# Patient Record
Sex: Male | Born: 1955 | Race: Black or African American | Hispanic: No | Smoking: Never smoker
Health system: Southern US, Community
[De-identification: ages and names within clinical notes are randomized; demographics above are authoritative.]

## PROBLEM LIST (undated history)

## (undated) DIAGNOSIS — I10 Essential (primary) hypertension: Secondary | ICD-10-CM

## (undated) DIAGNOSIS — E119 Type 2 diabetes mellitus without complications: Secondary | ICD-10-CM

---

## 1998-05-13 ENCOUNTER — Emergency Department (HOSPITAL_COMMUNITY): Admission: EM | Admit: 1998-05-13 | Discharge: 1998-05-13 | Payer: Self-pay | Admitting: Emergency Medicine

## 2007-07-08 ENCOUNTER — Emergency Department (HOSPITAL_COMMUNITY): Admission: EM | Admit: 2007-07-08 | Discharge: 2007-07-08 | Payer: Self-pay | Admitting: Emergency Medicine

## 2008-06-20 ENCOUNTER — Emergency Department (HOSPITAL_COMMUNITY): Admission: EM | Admit: 2008-06-20 | Discharge: 2008-06-20 | Payer: Self-pay | Admitting: Family Medicine

## 2009-12-08 ENCOUNTER — Emergency Department (HOSPITAL_COMMUNITY): Admission: EM | Admit: 2009-12-08 | Discharge: 2009-12-08 | Payer: Self-pay | Admitting: Emergency Medicine

## 2010-03-22 ENCOUNTER — Emergency Department (HOSPITAL_COMMUNITY): Admission: EM | Admit: 2010-03-22 | Discharge: 2010-03-22 | Payer: Self-pay | Admitting: Emergency Medicine

## 2011-02-09 LAB — DIFFERENTIAL
Basophils Absolute: 0.1 10*3/uL (ref 0.0–0.1)
Eosinophils Absolute: 0.5 10*3/uL (ref 0.0–0.7)
Lymphocytes Relative: 28 % (ref 12–46)
Neutro Abs: 4.7 10*3/uL (ref 1.7–7.7)
Neutrophils Relative %: 56 % (ref 43–77)

## 2011-02-09 LAB — CBC
Hemoglobin: 16.6 g/dL (ref 13.0–17.0)
MCHC: 33 g/dL (ref 30.0–36.0)
Platelets: 271 10*3/uL (ref 150–400)
RDW: 12.3 % (ref 11.5–15.5)

## 2011-02-09 LAB — BASIC METABOLIC PANEL
BUN: 17 mg/dL (ref 6–23)
CO2: 25 mEq/L (ref 19–32)
Calcium: 9.4 mg/dL (ref 8.4–10.5)
Creatinine, Ser: 0.91 mg/dL (ref 0.4–1.5)
Glucose, Bld: 179 mg/dL — ABNORMAL HIGH (ref 70–99)

## 2011-02-09 LAB — GLUCOSE, CAPILLARY: Glucose-Capillary: 237 mg/dL — ABNORMAL HIGH (ref 70–99)

## 2011-03-07 ENCOUNTER — Inpatient Hospital Stay (HOSPITAL_COMMUNITY)
Admission: EM | Admit: 2011-03-07 | Discharge: 2011-03-09 | DRG: 638 | Disposition: A | Discharge status: Home / Self Care | Payer: Medicaid Other | Attending: Internal Medicine | Admitting: Internal Medicine

## 2011-03-07 DIAGNOSIS — E1310 Other specified diabetes mellitus with ketoacidosis without coma: Principal | ICD-10-CM | POA: Diagnosis present

## 2011-03-07 DIAGNOSIS — D72823 Leukemoid reaction: Secondary | ICD-10-CM | POA: Diagnosis present

## 2011-03-07 DIAGNOSIS — N179 Acute kidney failure, unspecified: Secondary | ICD-10-CM | POA: Diagnosis present

## 2011-03-07 DIAGNOSIS — I10 Essential (primary) hypertension: Secondary | ICD-10-CM | POA: Diagnosis present

## 2011-03-07 DIAGNOSIS — T50995A Adverse effect of other drugs, medicaments and biological substances, initial encounter: Secondary | ICD-10-CM | POA: Diagnosis present

## 2011-03-07 DIAGNOSIS — Z9119 Patient's noncompliance with other medical treatment and regimen: Secondary | ICD-10-CM

## 2011-03-07 DIAGNOSIS — Z91199 Patient's noncompliance with other medical treatment and regimen due to unspecified reason: Secondary | ICD-10-CM

## 2011-03-07 DIAGNOSIS — D72829 Elevated white blood cell count, unspecified: Secondary | ICD-10-CM | POA: Diagnosis present

## 2011-03-07 LAB — GLUCOSE, CAPILLARY
Glucose-Capillary: 538 mg/dL — ABNORMAL HIGH (ref 70–99)
Glucose-Capillary: 350 mg/dL — ABNORMAL HIGH (ref 70–99)
Glucose-Capillary: 241 mg/dL — ABNORMAL HIGH (ref 70–99)

## 2011-03-07 LAB — CBC
MCHC: 34.2 g/dL (ref 30.0–36.0)
Platelets: 329 10*3/uL (ref 150–400)
RDW: 12.3 % (ref 11.5–15.5)

## 2011-03-07 LAB — COMPREHENSIVE METABOLIC PANEL
ALT: 31 U/L (ref 0–53)
AST: 21 U/L (ref 0–37)
Calcium: 9.2 mg/dL (ref 8.4–10.5)
GFR calc Af Amer: 34 mL/min — ABNORMAL LOW (ref >60)
Sodium: 135 mEq/L (ref 135–145)
Total Protein: 7.8 g/dL (ref 6.0–8.3)

## 2011-03-07 LAB — URINALYSIS, ROUTINE W REFLEX MICROSCOPIC
Bilirubin Urine: NEGATIVE
Glucose, UA: >1000 mg/dL — AB
Ketones, ur: NEGATIVE mg/dL
Leukocytes, UA: NEGATIVE
pH: 5 (ref 5.0–8.0)

## 2011-03-07 LAB — DIFFERENTIAL
Basophils Absolute: 0.1 10*3/uL (ref 0.0–0.1)
Basophils Relative: 1 % (ref 0–1)
Eosinophils Relative: 1 % (ref 0–5)
Monocytes Absolute: 1 10*3/uL (ref 0.1–1.0)
Neutro Abs: 11.2 10*3/uL — ABNORMAL HIGH (ref 1.7–7.7)

## 2011-03-07 LAB — BLOOD GAS, VENOUS
Acid-base deficit: 2.8 mmol/L — ABNORMAL HIGH (ref 0.0–2.0)
Patient temperature: 98.6
TCO2: 16.5 mmol/L (ref 0–100)
pH, Ven: 7.415 — ABNORMAL HIGH (ref 7.250–7.300)

## 2011-03-08 ENCOUNTER — Inpatient Hospital Stay (HOSPITAL_COMMUNITY): Payer: Medicaid Other

## 2011-03-08 LAB — GLUCOSE, CAPILLARY
Glucose-Capillary: 109 mg/dL — ABNORMAL HIGH (ref 70–99)
Glucose-Capillary: 145 mg/dL — ABNORMAL HIGH (ref 70–99)
Glucose-Capillary: 159 mg/dL — ABNORMAL HIGH (ref 70–99)
Glucose-Capillary: 212 mg/dL — ABNORMAL HIGH (ref 70–99)
Glucose-Capillary: 236 mg/dL — ABNORMAL HIGH (ref 70–99)
Glucose-Capillary: 86 mg/dL (ref 70–99)

## 2011-03-08 LAB — CBC
HCT: 43.6 % (ref 39.0–52.0)
Hemoglobin: 14.8 g/dL (ref 13.0–17.0)
RBC: 4.91 MIL/uL (ref 4.22–5.81)
WBC: 16.2 10*3/uL — ABNORMAL HIGH (ref 4.0–10.5)

## 2011-03-08 LAB — BASIC METABOLIC PANEL
CO2: 26 mEq/L (ref 19–32)
Chloride: 107 mEq/L (ref 96–112)
Glucose, Bld: 164 mg/dL — ABNORMAL HIGH (ref 70–99)
Potassium: 3.3 mEq/L — ABNORMAL LOW (ref 3.5–5.1)
Sodium: 143 mEq/L (ref 135–145)

## 2011-03-08 LAB — HEMOGLOBIN A1C: Hgb A1c MFr Bld: 12 % — ABNORMAL HIGH (ref <5.7)

## 2011-03-09 LAB — LIPID PANEL
LDL Cholesterol: 72 mg/dL (ref 0–99)
Total CHOL/HDL Ratio: 4.2 RATIO
VLDL: 29 mg/dL (ref 0–40)

## 2011-03-09 LAB — GLUCOSE, CAPILLARY
Glucose-Capillary: 160 mg/dL — ABNORMAL HIGH (ref 70–99)
Glucose-Capillary: 228 mg/dL — ABNORMAL HIGH (ref 70–99)
Glucose-Capillary: 254 mg/dL — ABNORMAL HIGH (ref 70–99)

## 2011-03-09 LAB — COMPREHENSIVE METABOLIC PANEL
Albumin: 3 g/dL — ABNORMAL LOW (ref 3.5–5.2)
Alkaline Phosphatase: 91 U/L (ref 39–117)
BUN: 16 mg/dL (ref 6–23)
Chloride: 104 mEq/L (ref 96–112)
Creatinine, Ser: 0.83 mg/dL (ref 0.4–1.5)
GFR calc non Af Amer: >60 mL/min (ref >60)
Glucose, Bld: 153 mg/dL — ABNORMAL HIGH (ref 70–99)
Total Bilirubin: 1.9 mg/dL — ABNORMAL HIGH (ref 0.3–1.2)

## 2011-03-09 LAB — URINE CULTURE
Culture  Setup Time: 201204152004
Culture: NO GROWTH

## 2011-03-09 LAB — CBC
HCT: 42.6 % (ref 39.0–52.0)
MCH: 30 pg (ref 26.0–34.0)
MCV: 87.5 fL (ref 78.0–100.0)
Platelets: 218 10*3/uL (ref 150–400)
RBC: 4.87 MIL/uL (ref 4.22–5.81)

## 2011-03-09 LAB — TSH: TSH: 0.74 u[IU]/mL (ref 0.350–4.500)

## 2011-03-09 LAB — MAGNESIUM: Magnesium: 1.8 mg/dL (ref 1.5–2.5)

## 2011-03-09 NOTE — Discharge Summary (Signed)
Bobby Palmer, Bobby Palmer              ACCOUNT NO.:  [id number]  MEDICAL RECORD NO.:  [id number]           PATIENT TYPE:  I  LOCATION:  1510                         FACILITY:  [facility]  PHYSICIAN:  Conley Canal, MD      DATE OF BIRTH:  [birth year]  DATE OF ADMISSION:  03/07/2011 DATE OF DISCHARGE:  03/09/2011                         DISCHARGE SUMMARY-REFERRING   DISCHARGE DIAGNOSES: 1. Diabetes mellitus type 2, poorly controlled resulting in     hyperglycemia and mild diabetic ketoacidosis. 2. Acute renal failure related to dehydration, medications including     metformin, triamterene, HCTZ, and Bactrim. 3. Leukocytosis possibly partially treated of urinary source versus     leukemoid reaction. 4. Hypertension, well controlled.  DISCHARGE MEDICATIONS: 1. Amlodipine 5 mg daily. 2. Lantus 10 units subcu at bedtime. 3. Metformin 850 mg p.o. b.i.d. 4. Ciprofloxacin 500 mg twice daily for 5 more days. 5. Aspirin 81 mg daily.  PROCEDURES PERFORMED:  Chest x-ray, on March 09, 2011, showed no evidence of pulmonary disease.  HOSPITAL COURSE:  Mr. Herder is a pleasant 55 year old gentleman with comorbidities as noted above, who came to the hospital with complaints of vomiting preceded by few days of sore throat and fever for which he had been given Bactrim by his primary care provider.  At the time of admission, the patient was found to be hyperglycemic with glucose more than 688.  He was also in acute renal failure with a BUN of 29, creatinine 2.45.  His urinalysis was significant for glucosuria and his hemoglobin A1c was found to be 12.  He had leukocytosis with a white count of 16,000 and his urine culture was eventually negative.  The patient was given Bactrim, adjusted for creatinine clearance.  He will be discharged on ciprofloxacin for 5 more days.  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  A malaria smear was sent prior to patient discharge.  I will follow up  the malaria smear.  If it is abnormal, communicate with the patient and arrange for treatments.  I doubt that he has cyclical malaria, which has not been treated already. He is originally from [place].  Otherwise, it seems the main issue has been noncompliant.  Today, the patient feels much better.  His white count is 10.6.  The rest of the CBC is normal.  Electrolytes significant for potassium of 3.3, this was replenished.  Magnesium 1.8, also replenished.  Lipids panel significant for HDL 32, LDL 72, total cholesterol 161, TSH normal.  I encouraged him to keep a logbook of fingersticks before meals and to take this logbook to his primary care provider next week for further adjustment of medications.  At this point with a hemoglobin of 12, he needs to be on insulin until the hemoglobin has improved significantly, so I am discharging him on Lantus and metformin and he will need further adjustments of the metformin and Lantus by his PCP.  He wants to change PCPs to [facility], but the earliest appointment some time in May and June, so he will see Dr. Mayford Knife next week.  Otherwise he is discharged in stable condition.  Conley Canal, MD     SR/MEDQ  D:  03/09/2011  T:  03/09/2011  Job:  [id number]  cc:   Dr. Mayford Knife at [facility]  Electronically Signed by Conley Canal  on 03/09/2011 09:14:57 PM

## 2011-03-10 LAB — MALARIA SMEAR

## 2011-03-31 NOTE — H&P (Signed)
[name], Bobby Palmer              ACCOUNT NO.:  [id number]  MEDICAL RECORD NO.:  [id number]           PATIENT TYPE:  E  LOCATION:  WLED                         FACILITY:  [facility]  PHYSICIAN:  Calvert Cantor, M.D.     DATE OF BIRTH:  [birth year]  DATE OF ADMISSION:  03/07/2011 DATE OF DISCHARGE:                             HISTORY & PHYSICAL   PRIMARY CARE PHYSICIAN:  Dr. Mayford Knife on [facility] in [place].  PRESENTING COMPLAINT:  Vomiting.  HISTORY OF PRESENT ILLNESS:  A 55 year old male with a history of diabetes and hypertension comes in with vomiting that started today.  The patient states that few days ago, he had a sore throat and fever and went to his doctor's office and was prescribed an antibiotic.  He is not sure if the fever has resolved but he still has a sore throat.  He started vomiting today.  He has had increased thirst and has been drinking a lot of fluids over the past few days and urinating excessively.  He feels extremely weak.  The patient does not admit to any cough, shortness of breath, or sinus trouble.  Other than a sore throat, he has no symptoms of infection.  PAST MEDICAL HISTORY: 1. Hypertension. 2. Diabetes for the past 2 years.  PAST SURGICAL HISTORY:  None.  SOCIAL HISTORY:  Nonsmoker, does not drink, does not use any drugs, currently not working.  Lives with his wife and kids.  ALLERGIES:  No known drug allergies.  FAMILY HISTORY:  He is not aware of any medical problems in the family.  HOME MEDICATIONS: 1. Septra 800 mg/160 twice a day. 2. Triamterene/HCTZ 37.5/25 daily. 3. Metformin 500 mg twice a day.  He was for some reason given another     prescription for metformin by his PCP a few days ago.  He went and     got it failed.  He has not realized that he is already taking     metformin and has actually been taking the 500 mg 2 tabs in the     morning and 2 at night.  REVIEW OF SYSTEMS:  CONSTITUTIONAL:  He has lost about 6-7  pounds in the past few days.  He is not sure if he is still febrile.  He has fatigue. HEENT:  No blurred vision, double vision.  He has a sore throat.  No sinus trouble.  No earache or fullness in his ears.  RESPIRATORY:  No shortness of breath or cough.  No wheezing.  CARDIAC:  No chest pain, palpitations, or pedal edema.  GI:  Vomiting.  No abdominal pain.  No diarrhea or constipation.  GU:  Increased frequency of micturition, excessive urination.  No dysuria or hematuria.  HEMATOLOGIC:  No easy bruising.  SKIN:  No rash.  MUSCULOSKELETAL:  No joint pain or back pain.  NEUROLOGICAL:  No focal numbness, weakness, or tingling.  No history of strokes or seizures.  PSYCHOLOGICAL:  No anxiety, depression.  PHYSICAL EXAMINATION:  VITAL SIGNS:  Blood pressure 126/87, pulse 112, respiratory rate 18, temperature 98.3, oxygen saturation 97% on room air. HEENT:  Pupils  equal, round, reactive to light.  Extraocular movements are intact.  Conjunctivae is pink.  No scleral icterus.  Oral mucosa dry.  Oropharynx clear. NECK:  Supple.  No thyromegaly, lymphadenopathy, carotid bruits. HEART:  Regular rate and rhythm.  No murmurs, rubs, or gallops. LUNGS:  Clear bilaterally.  Normal respiratory effort.  No use of accessory muscles. ABDOMEN:  Soft, nontender, nondistended.  Bowel sounds positive.  No organomegaly. EXTREMITIES:  No cyanosis, clubbing, or edema.  Pedal pulses positive. NEUROLOGICAL:  Cranial nerves II-XII intact.  Strength intact in all 4 extremities. PSYCHOLOGICAL:  Awake, alert, oriented x3.  Mood and affect normal. SKIN:  Warm, dry.  No rash or bruising.  BLOOD WORK:  Abnormal blood work includes a BUN of 29 and a creatinine of 2.45 and WBC count 14.1.  ABG reveals a pH of 7.41, pCO2 31.9, pO2 73.6, oxygen saturation was 93%, bicarb 26.  EKG shows sinus tachycardia at 102 beats per minute with a prolonged QTC at 508 milliseconds.  ASSESSMENT AND PLAN: 1. Hyperglycemia.  The  patient is on a glucommander.  We will get a     hemoglobin A1c.  We will need to hold his metformin due to renal     failure.  He will be on clear liquids for now. 2. Vomiting.  Hydrate with IV fluids, clear liquids as mentioned     above, advance to regular as tolerated. 3. Acute renal failure with a creatinine of 2.45, previously his     creatinine was normal.  We will continue IV fluids, hold metformin     and triamterene/HCTZ.  This may be a combination of ATN and     dehydration.  If creatinine does not improve, we can do further     workup which would also include a renal ultrasound. 4. Questionable bacterial infection.  He is on Septra DS.  We will     place him on a single strength Septra as his creatinine clearance     is 30. 5. Hypertension.  Hold triamterene and HCTZ.  For now I will place him     on Norvasc. 6. Prolonged QTC.  Be careful with medications that can further     prolong his QTC. 7. Alkalosis likely secondary to vomiting.  He may have been vomiting     more than just today. 8. Deep venous thrombosis prophylaxis with Lovenox.  Time on admission 55 minutes.    Calvert Cantor, M.D.    SR/MEDQ  D:  03/07/2011  T:  03/07/2011  Job:  [id number]  cc:   Dr. Mayford Knife  Electronically Signed by Calvert Cantor M.D. on 03/31/2011 11:17:50 PM

## 2011-08-20 LAB — POCT URINALYSIS DIP (DEVICE)
Hgb urine dipstick: NEGATIVE
Ketones, ur: >=160 — AB
Operator id: 247071
Protein, ur: 30 — AB
Specific Gravity, Urine: 1.02
Urobilinogen, UA: 0.2

## 2011-08-20 LAB — POCT I-STAT, CHEM 8
BUN: 16
Calcium, Ion: 1.17
Creatinine, Ser: 0.9
Glucose, Bld: 314 — ABNORMAL HIGH
Hemoglobin: 18.7 — ABNORMAL HIGH
TCO2: 22

## 2015-01-07 ENCOUNTER — Emergency Department (HOSPITAL_COMMUNITY): Payer: Medicaid Other

## 2015-01-07 ENCOUNTER — Encounter (HOSPITAL_COMMUNITY): Payer: Self-pay | Admitting: *Deleted

## 2015-01-07 ENCOUNTER — Emergency Department (HOSPITAL_COMMUNITY)
Admission: EM | Admit: 2015-01-07 | Discharge: 2015-01-07 | Disposition: A | Discharge status: Home / Self Care | Payer: Medicaid Other | Attending: Emergency Medicine | Admitting: Emergency Medicine

## 2015-01-07 DIAGNOSIS — Z79899 Other long term (current) drug therapy: Secondary | ICD-10-CM | POA: Insufficient documentation

## 2015-01-07 DIAGNOSIS — R05 Cough: Secondary | ICD-10-CM | POA: Insufficient documentation

## 2015-01-07 DIAGNOSIS — I10 Essential (primary) hypertension: Secondary | ICD-10-CM | POA: Insufficient documentation

## 2015-01-07 DIAGNOSIS — R059 Cough, unspecified: Secondary | ICD-10-CM

## 2015-01-07 DIAGNOSIS — R42 Dizziness and giddiness: Secondary | ICD-10-CM | POA: Insufficient documentation

## 2015-01-07 DIAGNOSIS — R739 Hyperglycemia, unspecified: Secondary | ICD-10-CM

## 2015-01-07 DIAGNOSIS — E1165 Type 2 diabetes mellitus with hyperglycemia: Secondary | ICD-10-CM | POA: Insufficient documentation

## 2015-01-07 HISTORY — DX: Essential (primary) hypertension: I10

## 2015-01-07 HISTORY — DX: Type 2 diabetes mellitus without complications: E11.9

## 2015-01-07 LAB — BASIC METABOLIC PANEL
ANION GAP: 6 (ref 5–15)
BUN: 16 mg/dL (ref 6–23)
CALCIUM: 9.1 mg/dL (ref 8.4–10.5)
CHLORIDE: 102 mmol/L (ref 96–112)
CO2: 25 mmol/L (ref 19–32)
Creatinine, Ser: 0.96 mg/dL (ref 0.50–1.35)
GFR calc Af Amer: >90 mL/min (ref >90)
GFR calc non Af Amer: >90 mL/min (ref >90)
Glucose, Bld: 377 mg/dL — ABNORMAL HIGH (ref 70–99)
POTASSIUM: 3.8 mmol/L (ref 3.5–5.1)
SODIUM: 133 mmol/L — AB (ref 135–145)

## 2015-01-07 LAB — CBC
HEMATOCRIT: 48.9 % (ref 39.0–52.0)
HEMOGLOBIN: 16.7 g/dL (ref 13.0–17.0)
MCH: 30.6 pg (ref 26.0–34.0)
MCHC: 34.2 g/dL (ref 30.0–36.0)
MCV: 89.6 fL (ref 78.0–100.0)
Platelets: 245 10*3/uL (ref 150–400)
RBC: 5.46 MIL/uL (ref 4.22–5.81)
RDW: 12 % (ref 11.5–15.5)
WBC: 9.3 10*3/uL (ref 4.0–10.5)

## 2015-01-07 LAB — CBG MONITORING, ED: Glucose-Capillary: 288 mg/dL — ABNORMAL HIGH (ref 70–99)

## 2015-01-07 MED ORDER — SODIUM CHLORIDE 0.9 % IV BOLUS (SEPSIS)
1000.0000 mL | Freq: Once | INTRAVENOUS | Status: AC
  Administered 2015-01-07: 1000 mL via INTRAVENOUS

## 2015-01-07 MED ORDER — INSULIN ASPART 100 UNIT/ML ~~LOC~~ SOLN
15.0000 [IU] | Freq: Once | SUBCUTANEOUS | Status: AC
  Administered 2015-01-07: 15 [IU] via SUBCUTANEOUS
  Filled 2015-01-07: qty 1

## 2015-01-07 NOTE — ED Provider Notes (Signed)
CSN: [id number]     Arrival date & time 01/07/15  1830 History   First MD Initiated Contact with Patient 01/07/15 2201     Chief Complaint  Patient presents with  . Dizziness  . Cough     (Consider location/radiation/quality/duration/timing/severity/associated sxs/prior Treatment) HPI Comments: Patient presents to the ER for evaluation of dizziness. Patient reports that he felt an episode of dizziness which she described as being lightheaded earlier. He feels better now. There was no headache. He does not have blurred vision. Patient denies chest pain and shortness of breath, but he has had a cough for 1 day. There has not been any fever. Denies vomiting and diarrhea.  Patient is a 59 y.o. male presenting with dizziness and cough.  Dizziness Cough   Past Medical History  Diagnosis Date  . Hypertension   . Diabetes mellitus without complication    No past surgical history on file. No family history on file. History  Substance Use Topics  . Smoking status: Never Smoker   . Smokeless tobacco: Not on file  . Alcohol Use: No    Review of Systems  Respiratory: Positive for cough.   Neurological: Positive for dizziness.  All other systems reviewed and are negative.     Allergies  Review of patient's allergies indicates no known allergies.  Home Medications   Prior to Admission medications   Medication Sig Start Date End Date Taking? Authorizing Provider  atenolol (TENORMIN) 100 MG tablet Take 100 mg by mouth daily.   Yes Historical Provider, MD  glipiZIDE (GLUCOTROL) 10 MG tablet Take 10 mg by mouth daily before breakfast.   Yes Historical Provider, MD  hydrochlorothiazide (HYDRODIURIL) 25 MG tablet Take 25 mg by mouth daily.   Yes Historical Provider, MD  ibuprofen (ADVIL,MOTRIN) 200 MG tablet Take 200 mg by mouth every 6 (six) hours as needed for moderate pain.   Yes Historical Provider, MD  metFORMIN (GLUCOPHAGE) 1000 MG tablet Take 1,000 mg by mouth 2 (two) times daily  with a meal.   Yes Historical Provider, MD   BP 130/67 mmHg  Pulse 57  Temp(Src) 97.6 F (36.4 C) (Oral)  Resp 18  SpO2 100% Physical Exam  Constitutional: He is oriented to person, place, and time. He appears well-developed and well-nourished. No distress.  HENT:  Head: Normocephalic and atraumatic.  Right Ear: Hearing normal.  Left Ear: Hearing normal.  Nose: Nose normal.  Mouth/Throat: Oropharynx is clear and moist and mucous membranes are normal.  Eyes: Conjunctivae and EOM are normal. Pupils are equal, round, and reactive to light.  Neck: Normal range of motion. Neck supple.  Cardiovascular: Regular rhythm, S1 normal and S2 normal.  Exam reveals no gallop and no friction rub.   No murmur heard. Pulmonary/Chest: Effort normal and breath sounds normal. No respiratory distress. He exhibits no tenderness.  Abdominal: Soft. Normal appearance and bowel sounds are normal. There is no hepatosplenomegaly. There is no tenderness. There is no rebound, no guarding, no tenderness at McBurney's point and negative Murphy's sign. No hernia.  Musculoskeletal: Normal range of motion.  Neurological: He is alert and oriented to person, place, and time. He has normal strength. No cranial nerve deficit or sensory deficit. Coordination normal. GCS eye subscore is 4. GCS verbal subscore is 5. GCS motor subscore is 6.  Skin: Skin is warm, dry and intact. No rash noted. No cyanosis.  Psychiatric: He has a normal mood and affect. His speech is normal and behavior is normal. Thought content normal.  Nursing note and vitals reviewed.   ED Course  Procedures (including critical care time) Labs Review Labs Reviewed  BASIC METABOLIC PANEL - Abnormal; Notable for the following:    Sodium 133 (*)    Glucose, Bld 377 (*)    All other components within normal limits  CBC  CBG MONITORING, ED    Imaging Review Dg Chest 2 View  01/07/2015   CLINICAL DATA:  Cough and dizziness for 24 hr, history diabetes  mellitus, hypertension  EXAM: CHEST  2 VIEW  COMPARISON:  03/08/2011  FINDINGS: Normal heart size, mediastinal contours, and pulmonary vascularity.  Bronchitic changes without definite infiltrate, pleural effusion or pneumothorax.  Bones unremarkable.  IMPRESSION: Mild bronchitic changes without infiltrate.   Electronically Signed   By: Ulyses SouthwardMark  Boles M.D.   On: 01/07/2015 22:43     EKG Interpretation None      MDM   Final diagnoses:  Cough  Hyperglycemia  Dizziness    Patient presents to the ER for evaluation of dizziness and cough. His lungs are clear to auscultation and his chest x-ray was essentially normal. Basic labs are unremarkable other than elevated blood sugar. Patient does have a history of diabetes. He was administered insulin and IV fluids. He has a normal neurologic exam, does not require any further workup at this time. Patient reassured, will be discharged.    Gilda Creasehristopher J. Dianely Krehbiel, MD 01/07/15 [phone]

## 2015-01-07 NOTE — Discharge Instructions (Signed)
Hyperglycemia °Hyperglycemia occurs when the glucose (sugar) in your blood is too high. Hyperglycemia can happen for many reasons, but it most often happens to people who do not know they have diabetes or are not managing their diabetes properly.  °CAUSES  °Whether you have diabetes or not, there are other causes of hyperglycemia. Hyperglycemia can occur when you have diabetes, but it can also occur in other situations that you might not be as aware of, such as: °Diabetes °· If you have diabetes and are having problems controlling your blood glucose, hyperglycemia could occur because of some of the following reasons: °· Not following your meal plan. °· Not taking your diabetes medications or not taking it properly. °· Exercising less or doing less activity than you normally do. °· Being sick. °Pre-diabetes °· This cannot be ignored. Before people develop Type 2 diabetes, they almost always have "pre-diabetes." This is when your blood glucose levels are higher than normal, but not yet high enough to be diagnosed as diabetes. Research has shown that some long-term damage to the body, especially the heart and circulatory system, may already be occurring during pre-diabetes. If you take action to manage your blood glucose when you have pre-diabetes, you may delay or prevent Type 2 diabetes from developing. °Stress °· If you have diabetes, you may be "diet" controlled or on oral medications or insulin to control your diabetes. However, you may find that your blood glucose is higher than usual in the hospital whether you have diabetes or not. This is often referred to as "stress hyperglycemia." Stress can elevate your blood glucose. This happens because of hormones put out by the body during times of stress. If stress has been the cause of your high blood glucose, it can be followed regularly by your caregiver. That way he/she can make sure your hyperglycemia does not continue to get worse or progress to  diabetes. °Steroids °· Steroids are medications that act on the infection fighting system (immune system) to block inflammation or infection. One side effect can be a rise in blood glucose. Most people can produce enough extra insulin to allow for this rise, but for those who cannot, steroids make blood glucose levels go even higher. It is not unusual for steroid treatments to "uncover" diabetes that is developing. It is not always possible to determine if the hyperglycemia will go away after the steroids are stopped. A special blood test called an A1c is sometimes done to determine if your blood glucose was elevated before the steroids were started. °SYMPTOMS °· Thirsty. °· Frequent urination. °· Dry mouth. °· Blurred vision. °· Tired or fatigue. °· Weakness. °· Sleepy. °· Tingling in feet or leg. °DIAGNOSIS  °Diagnosis is made by monitoring blood glucose in one or all of the following ways: °· A1c test. This is a chemical found in your blood. °· Fingerstick blood glucose monitoring. °· Laboratory results. °TREATMENT  °First, knowing the cause of the hyperglycemia is important before the hyperglycemia can be treated. Treatment may include, but is not be limited to: °· Education. °· Change or adjustment in medications. °· Change or adjustment in meal plan. °· Treatment for an illness, infection, etc. °· More frequent blood glucose monitoring. °· Change in exercise plan. °· Decreasing or stopping steroids. °· Lifestyle changes. °HOME CARE INSTRUCTIONS  °· Test your blood glucose as directed. °· Exercise regularly. Your caregiver will give you instructions about exercise. Pre-diabetes or diabetes which comes on with stress is helped by exercising. °· Eat wholesome,   balanced meals. Eat often and at regular, fixed times. Your caregiver or nutritionist will give you a meal plan to guide your sugar intake.  Being at an ideal weight is important. If needed, losing as little as 10 to 15 pounds may help improve blood  glucose levels. SEEK MEDICAL CARE IF:   You have questions about medicine, activity, or diet.  You continue to have symptoms (problems such as increased thirst, urination, or weight gain). SEEK IMMEDIATE MEDICAL CARE IF:   You are vomiting or have diarrhea.  Your breath smells fruity.  You are breathing faster or slower.  You are very sleepy or incoherent.  You have numbness, tingling, or pain in your feet or hands.  You have chest pain.  Your symptoms get worse even though you have been following your caregiver's orders.  If you have any other questions or concerns. Document Released: 05/04/2001 Document Revised: 01/31/2012 Document Reviewed: 03/06/2012 [employer] Patient Information 2015 [place]. This information is not intended to replace advice given to you by your health care provider. Make sure you discuss any questions you have with your health care provider.  Dizziness Dizziness is a common problem. It is a feeling of unsteadiness or light-headedness. You may feel like you are about to faint. Dizziness can lead to injury if you stumble or fall. A person of any age group can suffer from dizziness, but dizziness is more common in older adults. CAUSES  Dizziness can be caused by many different things, including:  Middle ear problems.  Standing for too long.  Infections.  An allergic reaction.  Aging.  An emotional response to something, such as the sight of blood.  Side effects of medicines.  Tiredness.  Problems with circulation or blood pressure.  Excessive use of alcohol or medicines, or illegal drug use.  Breathing too fast (hyperventilation).  An irregular heart rhythm (arrhythmia).  A low red blood cell count (anemia).  Pregnancy.  Vomiting, diarrhea, fever, or other illnesses that cause body fluid loss (dehydration).  Diseases or conditions such as Parkinson's disease, high blood pressure (hypertension), diabetes, and thyroid  problems.  Exposure to extreme heat. DIAGNOSIS  Your health care provider will ask about your symptoms, perform a physical exam, and perform an electrocardiogram (ECG) to record the electrical activity of your heart. Your health care provider may also perform other heart or blood tests to determine the cause of your dizziness. These may include:  Transthoracic echocardiogram (TTE). During echocardiography, sound waves are used to evaluate how blood flows through your heart.  Transesophageal echocardiogram (TEE).  Cardiac monitoring. This allows your health care provider to monitor your heart rate and rhythm in real time.  Holter monitor. This is a portable device that records your heartbeat and can help diagnose heart arrhythmias. It allows your health care provider to track your heart activity for several days if needed.  Stress tests by exercise or by giving medicine that makes the heart beat faster. TREATMENT  Treatment of dizziness depends on the cause of your symptoms and can vary greatly. HOME CARE INSTRUCTIONS   Drink enough fluids to keep your urine clear or pale yellow. This is especially important in very hot weather. In older adults, it is also important in cold weather.  Take your medicine exactly as directed if your dizziness is caused by medicines. When taking blood pressure medicines, it is especially important to get up slowly.  Rise slowly from chairs and steady yourself until you feel okay.  In the morning,  first sit up on the side of the bed. When you feel okay, stand slowly while holding onto something until you know your balance is fine.  Move your legs often if you need to stand in one place for a long time. Tighten and relax your muscles in your legs while standing.  Have someone stay with you for 1-2 days if dizziness continues to be a problem. Do this until you feel you are well enough to stay alone. Have the person call your health care provider if he or she  notices changes in you that are concerning.  Do not drive or use heavy machinery if you feel dizzy.  Do not drink alcohol. SEEK IMMEDIATE MEDICAL CARE IF:   Your dizziness or light-headedness gets worse.  You feel nauseous or vomit.  You have problems talking, walking, or using your arms, hands, or legs.  You feel weak.  You are not thinking clearly or you have trouble forming sentences. It may take a friend or family member to notice this.  You have chest pain, abdominal pain, shortness of breath, or sweating.  Your vision changes.  You notice any bleeding.  You have side effects from medicine that seems to be getting worse rather than better. MAKE SURE YOU:   Understand these instructions.  Will watch your condition.  Will get help right away if you are not doing well or get worse. Document Released: 05/04/2001 Document Revised: 11/13/2013 Document Reviewed: 05/28/2011 [facility] Patient Information 2015 [place], [place]. This information is not intended to replace advice given to you by your health care provider. Make sure you discuss any questions you have with your health care provider.

## 2015-01-07 NOTE — ED Notes (Signed)
Pt reports hx of DM and HTN, dizziness and cough x1 day. Takes metformin for DM. Denies pain. Denies n/v/d.

## 2015-01-22 ENCOUNTER — Emergency Department (HOSPITAL_COMMUNITY)
Admission: EM | Admit: 2015-01-22 | Discharge: 2015-01-22 | Disposition: A | Discharge status: Home / Self Care | Payer: Medicaid Other | Attending: Emergency Medicine | Admitting: Emergency Medicine

## 2015-01-22 ENCOUNTER — Encounter (HOSPITAL_COMMUNITY): Payer: Self-pay

## 2015-01-22 DIAGNOSIS — E1165 Type 2 diabetes mellitus with hyperglycemia: Secondary | ICD-10-CM | POA: Insufficient documentation

## 2015-01-22 DIAGNOSIS — R739 Hyperglycemia, unspecified: Secondary | ICD-10-CM

## 2015-01-22 DIAGNOSIS — Z791 Long term (current) use of non-steroidal anti-inflammatories (NSAID): Secondary | ICD-10-CM | POA: Insufficient documentation

## 2015-01-22 DIAGNOSIS — I10 Essential (primary) hypertension: Secondary | ICD-10-CM | POA: Insufficient documentation

## 2015-01-22 DIAGNOSIS — Z79899 Other long term (current) drug therapy: Secondary | ICD-10-CM | POA: Insufficient documentation

## 2015-01-22 LAB — COMPREHENSIVE METABOLIC PANEL
ALBUMIN: 4.3 g/dL (ref 3.5–5.2)
ALK PHOS: 112 U/L (ref 39–117)
ALT: 31 U/L (ref 0–53)
AST: 19 U/L (ref 0–37)
Anion gap: 8 (ref 5–15)
BUN: 13 mg/dL (ref 6–23)
CALCIUM: 9.1 mg/dL (ref 8.4–10.5)
CHLORIDE: 101 mmol/L (ref 96–112)
CO2: 23 mmol/L (ref 19–32)
CREATININE: 0.87 mg/dL (ref 0.50–1.35)
GFR calc non Af Amer: >90 mL/min (ref >90)
Glucose, Bld: 313 mg/dL — ABNORMAL HIGH (ref 70–99)
Potassium: 3.8 mmol/L (ref 3.5–5.1)
Sodium: 132 mmol/L — ABNORMAL LOW (ref 135–145)
Total Bilirubin: 0.8 mg/dL (ref 0.3–1.2)
Total Protein: 7.9 g/dL (ref 6.0–8.3)

## 2015-01-22 LAB — CBC
HCT: 51.7 % (ref 39.0–52.0)
HEMOGLOBIN: 18.2 g/dL — AB (ref 13.0–17.0)
MCH: 30.8 pg (ref 26.0–34.0)
MCHC: 35.2 g/dL (ref 30.0–36.0)
MCV: 87.6 fL (ref 78.0–100.0)
Platelets: 244 10*3/uL (ref 150–400)
RBC: 5.9 MIL/uL — ABNORMAL HIGH (ref 4.22–5.81)
RDW: 11.8 % (ref 11.5–15.5)
WBC: 8.3 10*3/uL (ref 4.0–10.5)

## 2015-01-22 LAB — CBG MONITORING, ED: Glucose-Capillary: 306 mg/dL — ABNORMAL HIGH (ref 70–99)

## 2015-01-22 NOTE — ED Notes (Signed)
Pt unable to provide a urine sample at this time.

## 2015-01-22 NOTE — ED Notes (Signed)
Pt alert and oriented x4. Respirations even and unlabored, bilateral symmetrical rise and fall of chest. Skin warm and dry. In no acute distress. Denies needs.   

## 2015-01-22 NOTE — ED Notes (Signed)
Per pt, elevated cbg 400's at MD office.  Today, meter ran 500's at home.  MD told pt to increase meds at home.  No other symptoms of hyperglycemia

## 2015-01-22 NOTE — Discharge Instructions (Signed)
Please call your doctor for a followup appointment within 24-48 hours. When you talk to your doctor please let them know that you were seen in the emergency department and have them acquire all of your records so that they can discuss the findings with you and formulate a treatment plan to fully care for your new and ongoing problems. ° °

## 2015-01-22 NOTE — ED Notes (Signed)
Pt escorted to discharge window. Pt verbalized understanding discharge instructions. In no acute distress.  

## 2015-01-22 NOTE — ED Provider Notes (Signed)
CSN: [id number]     Arrival date & time 01/22/15  0917 History   First MD Initiated Contact with Patient 01/22/15 [phone]     Chief Complaint  Patient presents with  . Hyperglycemia     (Consider location/radiation/quality/duration/timing/severity/associated sxs/prior Treatment) HPI Comments: The patient is a 59 year old male who has a history of diabetes and hypertension, he presents to the hospital after finding that he had high blood sugar at home today. He was seen at his doctor's office yesterday, his blood sugar was over 400, his family doctor prescribed both metformin and glimepiride which she has taken this morning, he states that he went to the pharmacy today and bought a glucometer which read over 500 and prompted his visit. He has no abnormal complaints, no chest pain shortness of breath fevers chills nausea vomiting diarrhea dysuria swelling weakness numbness or any other complaints.  Patient is a 59 y.o. male presenting with hyperglycemia. The history is provided by the patient.  Hyperglycemia   Past Medical History  Diagnosis Date  . Hypertension   . Diabetes mellitus without complication    History reviewed. No pertinent past surgical history. History reviewed. No pertinent family history. History  Substance Use Topics  . Smoking status: Never Smoker   . Smokeless tobacco: Not on file  . Alcohol Use: No    Review of Systems  All other systems reviewed and are negative.     Allergies  Review of patient's allergies indicates no known allergies.  Home Medications   Prior to Admission medications   Medication Sig Start Date End Date Taking? Authorizing Provider  atenolol (TENORMIN) 100 MG tablet Take 100 mg by mouth daily.   Yes Historical Provider, MD  glipiZIDE (GLUCOTROL) 10 MG tablet Take 10 mg by mouth daily before breakfast.   Yes Historical Provider, MD  hydrochlorothiazide (HYDRODIURIL) 25 MG tablet Take 25 mg by mouth daily.   Yes Historical Provider, MD   ibuprofen (ADVIL,MOTRIN) 200 MG tablet Take 200 mg by mouth every 6 (six) hours as needed for moderate pain.   Yes Historical Provider, MD  ibuprofen (ADVIL,MOTRIN) 800 MG tablet Take 800 mg by mouth 4 (four) times daily.   Yes Historical Provider, MD  metFORMIN (GLUCOPHAGE) 1000 MG tablet Take 1,000 mg by mouth 2 (two) times daily with a meal.   Yes Historical Provider, MD   BP 141/85 mmHg  Pulse 58  Temp(Src) 97.6 F (36.4 C) (Oral)  Resp 16  SpO2 95% Physical Exam  Constitutional: He appears well-developed and well-nourished. No distress.  HENT:  Head: Normocephalic and atraumatic.  Mouth/Throat: Oropharynx is clear and moist. No oropharyngeal exudate.  Eyes: Conjunctivae and EOM are normal. Pupils are equal, round, and reactive to light. Right eye exhibits no discharge. Left eye exhibits no discharge. No scleral icterus.  Neck: Normal range of motion. Neck supple. No JVD present. No thyromegaly present.  Cardiovascular: Normal rate, regular rhythm, normal heart sounds and intact distal pulses.  Exam reveals no gallop and no friction rub.   No murmur heard. Pulmonary/Chest: Effort normal and breath sounds normal. No respiratory distress. He has no wheezes. He has no rales.  Abdominal: Soft. Bowel sounds are normal. He exhibits no distension and no mass. There is no tenderness.  Musculoskeletal: Normal range of motion. He exhibits no edema or tenderness.  Lymphadenopathy:    He has no cervical adenopathy.  Neurological: He is alert. Coordination normal.  Skin: Skin is warm and dry. No rash noted. No erythema.  Psychiatric:  He has a normal mood and affect. His behavior is normal.  Nursing note and vitals reviewed.   ED Course  Procedures (including critical care time) Labs Review Labs Reviewed  CBC - Abnormal; Notable for the following:    RBC 5.90 (*)    Hemoglobin 18.2 (*)    All other components within normal limits  COMPREHENSIVE METABOLIC PANEL - Abnormal; Notable for  the following:    Sodium 132 (*)    Glucose, Bld 313 (*)    All other components within normal limits  CBG MONITORING, ED - Abnormal; Notable for the following:    Glucose-Capillary 306 (*)    All other components within normal limits  URINALYSIS, ROUTINE W REFLEX MICROSCOPIC  CBG MONITORING, ED    Imaging Review No results found.    MDM   Final diagnoses:  Hyperglycemia    The patient's vital signs are unremarkable, his physical exam is totally normal, his lab results show that he does have some hyperglycemia with a glucose of 300 but no signs of anion gap acidosis. At this time he appears stable to go home, recheck his glucometer, I have encouraged him to go to the pharmacy to get her reevaluated if it still read high, he can see his doctor in one week. No indication for further or more aggressive evaluation treatment or admission.    Vida Roller, MD 01/22/15 [phone]

## 2016-01-23 ENCOUNTER — Encounter (HOSPITAL_COMMUNITY): Payer: Self-pay | Admitting: *Deleted

## 2016-01-23 ENCOUNTER — Emergency Department (HOSPITAL_COMMUNITY): Payer: Self-pay

## 2016-01-23 ENCOUNTER — Emergency Department (HOSPITAL_COMMUNITY)
Admission: EM | Admit: 2016-01-23 | Discharge: 2016-01-23 | Disposition: A | Discharge status: Home / Self Care | Payer: Self-pay | Attending: Emergency Medicine | Admitting: Emergency Medicine

## 2016-01-23 DIAGNOSIS — G510 Bell's palsy: Secondary | ICD-10-CM | POA: Insufficient documentation

## 2016-01-23 DIAGNOSIS — Z79899 Other long term (current) drug therapy: Secondary | ICD-10-CM | POA: Insufficient documentation

## 2016-01-23 DIAGNOSIS — E119 Type 2 diabetes mellitus without complications: Secondary | ICD-10-CM | POA: Insufficient documentation

## 2016-01-23 DIAGNOSIS — I10 Essential (primary) hypertension: Secondary | ICD-10-CM | POA: Insufficient documentation

## 2016-01-23 DIAGNOSIS — Z7984 Long term (current) use of oral hypoglycemic drugs: Secondary | ICD-10-CM | POA: Insufficient documentation

## 2016-01-23 LAB — URINALYSIS, ROUTINE W REFLEX MICROSCOPIC
Bilirubin Urine: NEGATIVE
GLUCOSE, UA: 100 mg/dL — AB
HGB URINE DIPSTICK: NEGATIVE
KETONES UR: NEGATIVE mg/dL
LEUKOCYTES UA: NEGATIVE
Nitrite: NEGATIVE
PROTEIN: NEGATIVE mg/dL
Specific Gravity, Urine: 1.03 (ref 1.005–1.030)
pH: 6 (ref 5.0–8.0)

## 2016-01-23 LAB — I-STAT TROPONIN, ED: Troponin i, poc: 0 ng/mL (ref 0.00–0.08)

## 2016-01-23 LAB — DIFFERENTIAL
BASOS ABS: 0.1 10*3/uL (ref 0.0–0.1)
Basophils Relative: 1 %
Eosinophils Absolute: 0.8 10*3/uL — ABNORMAL HIGH (ref 0.0–0.7)
Eosinophils Relative: 8 %
LYMPHS ABS: 4.3 10*3/uL — AB (ref 0.7–4.0)
LYMPHS PCT: 41 %
Monocytes Absolute: 1 10*3/uL (ref 0.1–1.0)
Monocytes Relative: 9 %
NEUTROS PCT: 41 %
Neutro Abs: 4.4 10*3/uL (ref 1.7–7.7)

## 2016-01-23 LAB — PROTIME-INR
INR: 0.97 (ref 0.00–1.49)
Prothrombin Time: 13.1 seconds (ref 11.6–15.2)

## 2016-01-23 LAB — COMPREHENSIVE METABOLIC PANEL
ALK PHOS: 116 U/L (ref 38–126)
ALT: 41 U/L (ref 17–63)
AST: 25 U/L (ref 15–41)
Albumin: 4.6 g/dL (ref 3.5–5.0)
Anion gap: 10 (ref 5–15)
BUN: 18 mg/dL (ref 6–20)
CHLORIDE: 101 mmol/L (ref 101–111)
CO2: 26 mmol/L (ref 22–32)
CREATININE: 0.94 mg/dL (ref 0.61–1.24)
Calcium: 9.1 mg/dL (ref 8.9–10.3)
GFR calc Af Amer: >60 mL/min (ref >60)
GFR calc non Af Amer: >60 mL/min (ref >60)
Glucose, Bld: 157 mg/dL — ABNORMAL HIGH (ref 65–99)
Potassium: 3.2 mmol/L — ABNORMAL LOW (ref 3.5–5.1)
SODIUM: 137 mmol/L (ref 135–145)
Total Bilirubin: 0.7 mg/dL (ref 0.3–1.2)
Total Protein: 7.8 g/dL (ref 6.5–8.1)

## 2016-01-23 LAB — I-STAT CHEM 8, ED
BUN: 21 mg/dL — ABNORMAL HIGH (ref 6–20)
CHLORIDE: 98 mmol/L — AB (ref 101–111)
CREATININE: 0.9 mg/dL (ref 0.61–1.24)
Calcium, Ion: 1.08 mmol/L — ABNORMAL LOW (ref 1.12–1.23)
GLUCOSE: 158 mg/dL — AB (ref 65–99)
HEMATOCRIT: 53 % — AB (ref 39.0–52.0)
HEMOGLOBIN: 18 g/dL — AB (ref 13.0–17.0)
Potassium: 3.4 mmol/L — ABNORMAL LOW (ref 3.5–5.1)
Sodium: 141 mmol/L (ref 135–145)
TCO2: 29 mmol/L (ref 0–100)

## 2016-01-23 LAB — CBC
HEMATOCRIT: 47.8 % (ref 39.0–52.0)
Hemoglobin: 16.5 g/dL (ref 13.0–17.0)
MCH: 31 pg (ref 26.0–34.0)
MCHC: 34.5 g/dL (ref 30.0–36.0)
MCV: 89.8 fL (ref 78.0–100.0)
Platelets: 244 10*3/uL (ref 150–400)
RBC: 5.32 MIL/uL (ref 4.22–5.81)
RDW: 12.1 % (ref 11.5–15.5)
WBC: 10.6 10*3/uL — AB (ref 4.0–10.5)

## 2016-01-23 LAB — RAPID URINE DRUG SCREEN, HOSP PERFORMED
Amphetamines: NOT DETECTED
BARBITURATES: NOT DETECTED
Benzodiazepines: NOT DETECTED
COCAINE: NOT DETECTED
Opiates: NOT DETECTED
TETRAHYDROCANNABINOL: NOT DETECTED

## 2016-01-23 LAB — APTT: APTT: 33 s (ref 24–37)

## 2016-01-23 LAB — ETHANOL: Alcohol, Ethyl (B): <5 mg/dL (ref <5)

## 2016-01-23 MED ORDER — PREDNISONE 10 MG PO TABS: ORAL_TABLET | ORAL | Status: DC

## 2016-01-23 MED ORDER — VALACYCLOVIR HCL 1 G PO TABS: 1000.0000 mg | ORAL_TABLET | Freq: Three times a day (TID) | ORAL | Status: DC

## 2016-01-23 MED ORDER — FLUORESCEIN SODIUM 1 MG OP STRP
1.0000 | ORAL_STRIP | Freq: Once | OPHTHALMIC | Status: AC
  Administered 2016-01-23: 1 via OPHTHALMIC
  Filled 2016-01-23: qty 1

## 2016-01-23 MED ORDER — TETRACAINE HCL 0.5 % OP SOLN
1.0000 [drp] | Freq: Once | OPHTHALMIC | Status: AC
  Administered 2016-01-23: 1 [drp] via OPHTHALMIC
  Filled 2016-01-23: qty 4

## 2016-01-23 NOTE — ED Provider Notes (Signed)
CSN: [id number]     Arrival date & time 01/23/16  1039 History   First MD Initiated Contact with Patient 01/23/16 1538     Chief Complaint  Patient presents with  . Numbness    facial     (Consider location/radiation/quality/duration/timing/severity/associated sxs/prior Treatment) Patient is a 60 y.o. male presenting with Acute Neurological Problem. The history is provided by the patient.  Cerebrovascular Accident This is a new problem. The current episode started yesterday. The problem occurs constantly. The problem has not changed since onset.Pertinent negatives include no chest pain, no abdominal pain, no headaches and no shortness of breath. Nothing aggravates the symptoms. Nothing relieves the symptoms. He has tried nothing for the symptoms. The treatment provided no relief.   60 yo M with a chief complaint of right-sided facial droop. The started yesterday. Also has had some right eye watering. Denies any blurry vision. Denies head injury denies loss consciousness. Denies change in hearing. Denies painful rash or burning to his face. Denies sick contacts.   Past Medical History  Diagnosis Date  . Hypertension   . Diabetes mellitus without complication (HCC)    History reviewed. No pertinent past surgical history. History reviewed. No pertinent family history. Social History  Substance Use Topics  . Smoking status: Never Smoker   . Smokeless tobacco: None  . Alcohol Use: No    Review of Systems  Constitutional: Negative for fever and chills.  HENT: Negative for congestion and facial swelling.   Eyes: Negative for discharge and visual disturbance.  Respiratory: Negative for shortness of breath.   Cardiovascular: Negative for chest pain and palpitations.  Gastrointestinal: Negative for vomiting, abdominal pain and diarrhea.  Musculoskeletal: Negative for myalgias and arthralgias.  Skin: Negative for color change and rash.  Neurological: Positive for facial asymmetry and  speech difficulty (slurred). Negative for tremors, syncope and headaches.  Psychiatric/Behavioral: Negative for confusion and dysphoric mood.      Allergies  Review of patient's allergies indicates no known allergies.  Home Medications   Prior to Admission medications   Medication Sig Start Date End Date Taking? Authorizing Provider  atenolol (TENORMIN) 100 MG tablet Take 100 mg by mouth daily.   Yes Historical Provider, MD  glipiZIDE (GLUCOTROL) 10 MG tablet Take 10 mg by mouth daily before breakfast.   Yes Historical Provider, MD  hydrochlorothiazide (HYDRODIURIL) 25 MG tablet Take 25 mg by mouth daily.   Yes Historical Provider, MD  ibuprofen (ADVIL,MOTRIN) 800 MG tablet Take 800 mg by mouth 4 (four) times daily as needed for headache, mild pain or moderate pain.    Yes Historical Provider, MD  metFORMIN (GLUCOPHAGE) 1000 MG tablet Take 1,000 mg by mouth 2 (two) times daily with a meal.   Yes Historical Provider, MD  predniSONE (DELTASONE) 10 MG tablet 6 tabs po daily x 10 days 01/23/16   Melene Plan, DO  valACYclovir (VALTREX) 1000 MG tablet Take 1 tablet (1,000 mg total) by mouth 3 (three) times daily. 01/23/16   Melene Plan, DO   BP 146/83 mmHg  Pulse 73  Temp(Src) 97.5 F (36.4 C) (Oral)  Resp 16  SpO2 99% Physical Exam  Constitutional: He is oriented to person, place, and time. He appears well-developed and well-nourished.  HENT:  Head: Normocephalic and atraumatic.  Possible vesicles in the R ear canal, though adjacent some debris.   Eyes: EOM are normal. Pupils are equal, round, and reactive to light.  Negative fluoro exam  Neck: Normal range of motion. Neck supple. No  JVD present.  Cardiovascular: Normal rate and regular rhythm.  Exam reveals no gallop and no friction rub.   No murmur heard. Pulmonary/Chest: No respiratory distress. He has no wheezes.  Abdominal: He exhibits no distension. There is no rebound and no guarding.  Musculoskeletal: Normal range of motion.   Neurological: He is alert and oriented to person, place, and time. A cranial nerve deficit is present. No sensory deficit. GCS eye subscore is 4. GCS verbal subscore is 5. GCS motor subscore is 6.  Right sided facial droop  Skin: No rash noted. No pallor.  Psychiatric: He has a normal mood and affect. His behavior is normal.  Nursing note and vitals reviewed.   ED Course  Procedures (including critical care time) Labs Review Labs Reviewed  CBC - Abnormal; Notable for the following:    WBC 10.6 (*)    All other components within normal limits  DIFFERENTIAL - Abnormal; Notable for the following:    Lymphs Abs 4.3 (*)    Eosinophils Absolute 0.8 (*)    All other components within normal limits  COMPREHENSIVE METABOLIC PANEL - Abnormal; Notable for the following:    Potassium 3.2 (*)    Glucose, Bld 157 (*)    All other components within normal limits  URINALYSIS, ROUTINE W REFLEX MICROSCOPIC (NOT AT [facility]) - Abnormal; Notable for the following:    Glucose, UA 100 (*)    All other components within normal limits  I-STAT CHEM 8, ED - Abnormal; Notable for the following:    Potassium 3.4 (*)    Chloride 98 (*)    BUN 21 (*)    Glucose, Bld 158 (*)    Calcium, Ion 1.08 (*)    Hemoglobin 18.0 (*)    HCT 53.0 (*)    All other components within normal limits  ETHANOL  PROTIME-INR  APTT  URINE RAPID DRUG SCREEN, HOSP PERFORMED  I-STAT TROPOININ, ED    Imaging Review Ct Head Wo Contrast  01/23/2016  CLINICAL DATA:  Reports yesterday, had some numbness in rt mouth, slurred speech and watery rt eye, symptoms have resolved except eye watering, pt wants to be checked for all his health problems, diabetes, HTN, etc EXAM: CT HEAD WITHOUT CONTRAST TECHNIQUE: Contiguous axial images were obtained from the base of the skull through the vertex without intravenous contrast. COMPARISON:  07/08/2007 FINDINGS: The ventricles are normal in size and configuration. There are no parenchymal masses or  mass effect. There is no evidence of an infarct. There are no extra-axial masses or abnormal fluid collections. There is no intracranial hemorrhage. Visualized sinuses and mastoid air cells are clear. IMPRESSION: Normal unenhanced CT scan of the brain. Electronically Signed   By: Amie Portlandavid  Ormond M.D.   On: 01/23/2016 16:01   Mr Brain Wo Contrast  01/23/2016  CLINICAL DATA:  Numbness in the right side of the face. Slurred speech. Symptoms began yesterday. EXAM: MRI HEAD WITHOUT CONTRAST TECHNIQUE: Multiplanar, multiecho pulse sequences of the brain and surrounding structures were obtained without intravenous contrast. COMPARISON:  CT same day FINDINGS: Diffusion imaging does not show any acute or subacute infarction. No abnormality is seen affecting the brainstem or cerebellum. Within the cerebral hemispheres, there are scattered foci of abnormal T2 and FLAIR signal in the deep and subcortical white matter consistent with mild chronic small vessel disease. No cortical or large vessel territory infarction. No mass lesion, hemorrhage, hydrocephalus or extra-axial collection. No pituitary mass. No inflammatory sinus disease. No skull or skullbase lesion. Major  vessels at the base of the brain show flow. IMPRESSION: No acute finding. Mild chronic small-vessel ischemic changes present within the cerebral hemispheric white matter. Electronically Signed   By: Paulina Fusi M.D.   On: 01/23/2016 18:34   I have personally reviewed and evaluated these images and lab results as part of my medical decision-making.   EKG Interpretation   Date/Time:  Friday January 23 2016 16:07:25 EST Ventricular Rate:  64 PR Interval:  191 QRS Duration: 94 QT Interval:  563 QTC Calculation: 581 R Axis:   1 Text Interpretation:  Sinus rhythm Anteroseptal infarct, old Nonspecific T  abnormalities, lateral leads Prolonged QT interval Baseline wander in  lead(s) V5 No significant change since last tracing Confirmed by Raidon Swanner MD,  Reuel Boom  ([id number]) on 01/23/2016 4:23:33 PM      MDM   Final diagnoses:  Bell's palsy    60 yo M with a chief complaint of right-sided facial droop and slurred speech. The started yesterday. No improvement.  No hx of same.  On exam suspect bells palsy, though patient with htn, dm.  Subtle difference in forehead weakness.  Will discuss with neuro.   Discussed with Dr. Despina Hick, recommend MRI to rule out stroke, if negative, start on treatment for bells.    MRI negative.   10:40 PM:  I have discussed the diagnosis/risks/treatment options with the patient and family and believe the pt to be eligible for discharge home to follow-up with PCP. We also discussed returning to the ED immediately if new or worsening sx occur. We discussed the sx which are most concerning (e.g., sudden worsening pain, fever, inability to tolerate by mouth) that necessitate immediate return. Medications administered to the patient during their visit and any new prescriptions provided to the patient are listed below.  Medications given during this visit Medications  tetracaine (PONTOCAINE) 0.5 % ophthalmic solution 1 drop (1 drop Right Eye Given 01/23/16 1631)  fluorescein ophthalmic strip 1 strip (1 strip Right Eye Given 01/23/16 1631)    Discharge Medication List as of 01/23/2016  6:41 PM    START taking these medications   Details  predniSONE (DELTASONE) 10 MG tablet 6 tabs po daily x 10 days, Print    valACYclovir (VALTREX) 1000 MG tablet Take 1 tablet (1,000 mg total) by mouth 3 (three) times daily., Starting 01/23/2016, Until Discontinued, Print        The patient appears reasonably screen and/or stabilized for discharge and I doubt any other medical condition or other [facility] requiring further screening, evaluation, or treatment in the ED at this time prior to discharge.    Melene Plan, DO 01/23/16 2240

## 2016-01-23 NOTE — ED Notes (Signed)
Patient transported to CT 

## 2016-01-23 NOTE — Discharge Instructions (Signed)
Follow up with your family doc.  Bell Palsy Bell palsy is a condition in which the muscles on one side of the face become paralyzed. This often causes one side of the face to droop. It is a common condition and most people recover completely. RISK FACTORS Risk factors for Bell palsy include:  Pregnancy.  Diabetes.  An infection by a virus, such as infections that cause cold sores. CAUSES  Bell palsy is caused by damage to or inflammation of a nerve in your face. It is unclear why this happens, but an infection by a virus may lead to it. Most of the time the reason it happens is unknown. SIGNS AND SYMPTOMS  Symptoms can range from mild to severe and can take place over a number of hours. Symptoms may include:  Being unable to:  Raise one or both eyebrows.  Close one or both eyes.  Feel parts of your face (facial numbness).  Drooping of the eyelid and corner of the mouth.  Weakness in the face.  Paralysis of half your face.  Loss of taste.  Sensitivity to loud noises.  Difficulty chewing.  Tearing up of the affected eye.  Dryness in the affected eye.  Drooling.  Pain behind one ear. DIAGNOSIS  Diagnosis of Bell palsy may include:  A medical history and physical exam.  An MRI.  A CT scan.  Electromyography (EMG). This is a test that checks how your nerves are working. TREATMENT  Treatment may include antiviral medicine to help shorten the length of the condition. Sometimes treatment is not needed and the symptoms go away on their own. HOME CARE INSTRUCTIONS   Take medicines only as directed by your health care provider.  Do facial massages and exercises as directed by your health care provider.  If your eye is affected:  Use moisturizing eye drops to prevent drying of your eye as directed by your health care provider.  Protect your eye as directed by your health care provider. SEEK MEDICAL CARE IF:  Your symptoms do not get better or get worse.  You  are drooling.  Your eye is red, irritated, or hurts. SEEK IMMEDIATE MEDICAL CARE IF:   Another part of your body feels weak or numb.  You have difficulty swallowing.  You have a fever along with symptoms of Bell palsy.  You develop neck pain. MAKE SURE YOU:   Understand these instructions.  Will watch your condition.  Will get help right away if you are not doing well or get worse.   This information is not intended to replace advice given to you by your health care provider. Make sure you discuss any questions you have with your health care provider.   Document Released: 11/08/2005 Document Revised: 07/30/2015 Document Reviewed: 02/15/2014 Elsevier Interactive Patient Education [employer]2016 [employer]

## 2016-01-23 NOTE — ED Notes (Signed)
Reports yesterday, had some numbness in rt mouth, slurred speech and watery rt eye, symptoms have resolved except eye watering, pt wants to be checked for all his health problems, diabetes, HTN, etc

## 2016-01-23 NOTE — Progress Notes (Signed)
Pt confirms pcp is Dr Mikeal HawthorneGarba EPIC updated Pt very pleasant Male family member at bedside

## 2017-10-31 IMAGING — MR MR HEAD W/O CM
8 of 11 series · 37 of 48 positions shown · non-contrast
Comparison: CT same day

CLINICAL DATA: Numbness in the right side of the face. Slurred
speech. Symptoms began yesterday.

EXAM:
MRI HEAD WITHOUT CONTRAST
TECHNIQUE: Multiplanar, multiecho pulse sequences of the brain and surrounding
structures were obtained without intravenous contrast.

[Series 4: DWI · axial · 3.0mm · 1.09mm/px · z∈[-17,+126]mm · 9 of 98 slices shown (1 of 4)]
[im 1/98]
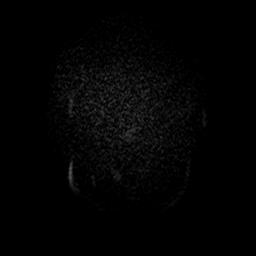
[im 13/98]
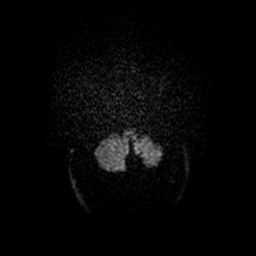
[im 25/98]
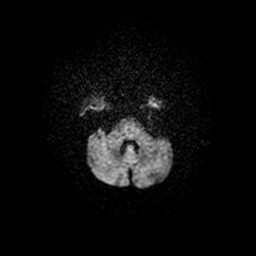
[im 37/98]
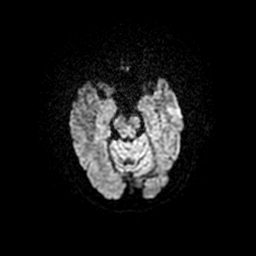
[im 49/98]
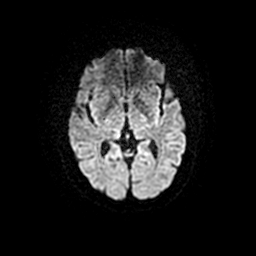
[im 61/98]
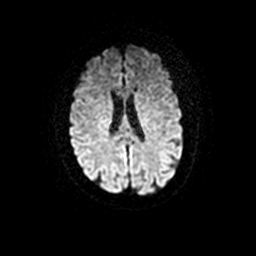
[im 73/98]
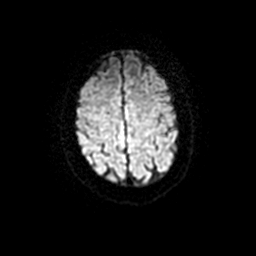
[im 85/98]
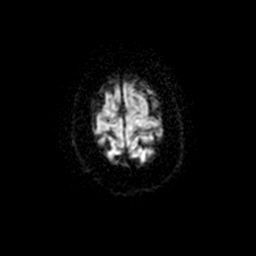
[im 98/98]
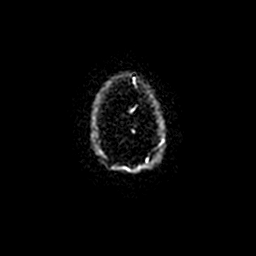

[Series 5: DWI · coronal · 5.0mm · 1.09mm/px · 8 of 74 slices shown (2 of 4)]
[im 1/74]
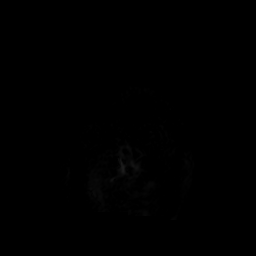
[im 11/74]
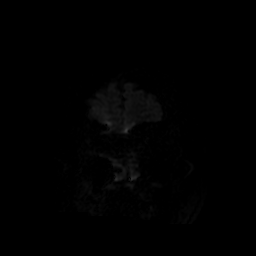
[im 21/74]
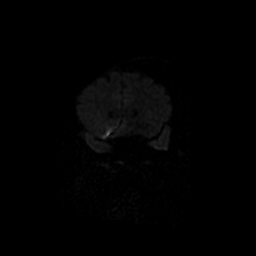
[im 32/74]
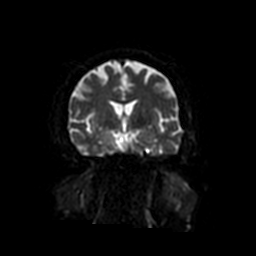
[im 42/74]
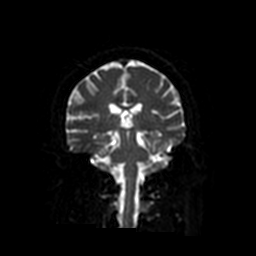
[im 53/74]
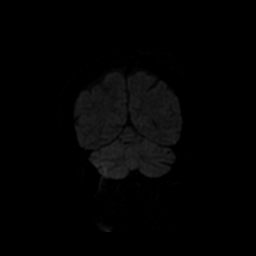
[im 63/74]
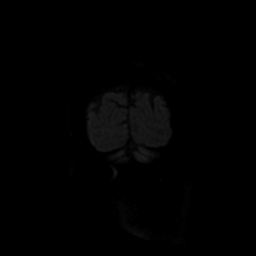
[im 74/74]
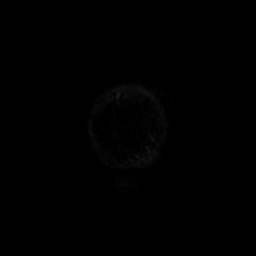

[Series 6: T2 · axial · 5.0mm · 0.43mm/px · z∈[-29,+117]mm · 3 of 24 slices shown (1 of 3)]
[im 1/24]
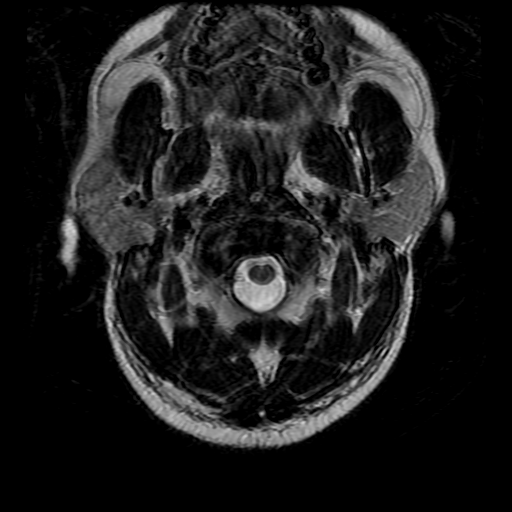
[im 12/24]
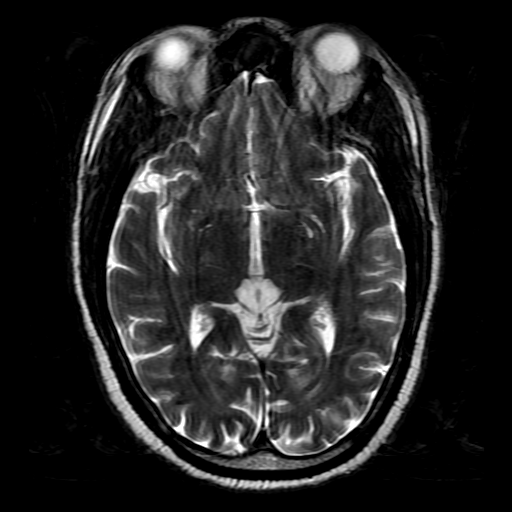
[im 24/24]
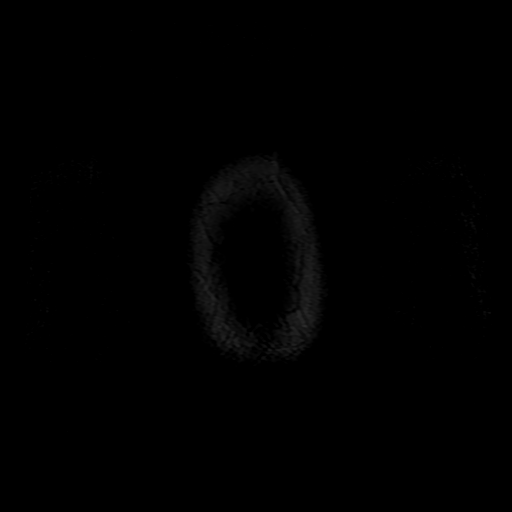

[Series 7: FLAIR · axial · 5.0mm · 0.43mm/px · z∈[-34,+122]mm · 3 of 24 slices shown]
[im 1/24]
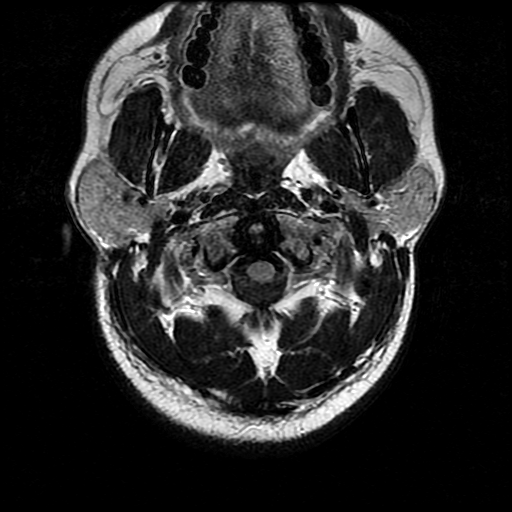
[im 12/24]
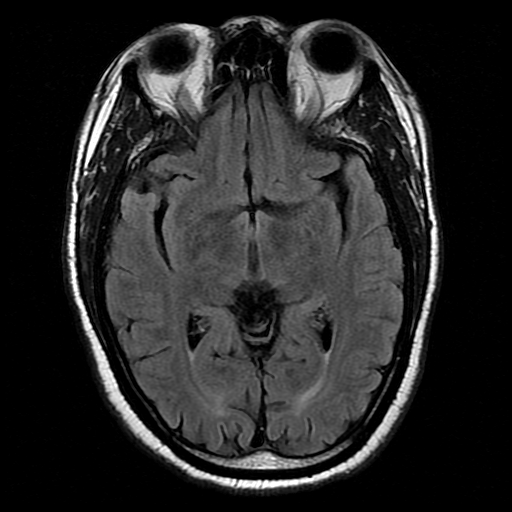
[im 24/24]
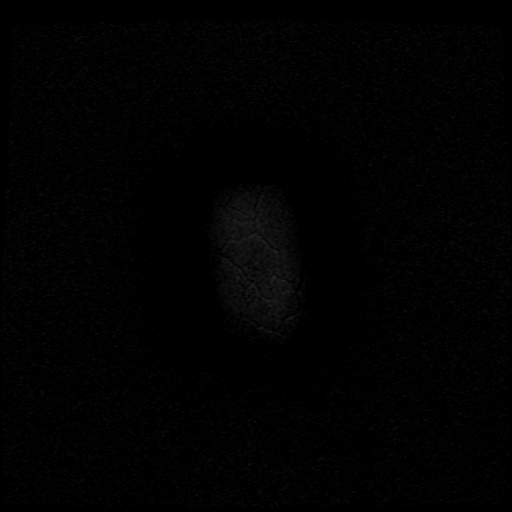

[Series 10: T2 · axial · 5.0mm · 0.43mm/px · z∈[-29,+117]mm · 3 of 24 slices shown (2 of 3)]
[im 1/24]
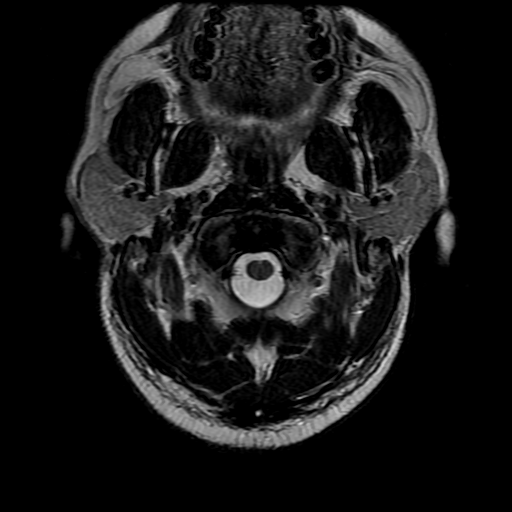
[im 12/24]
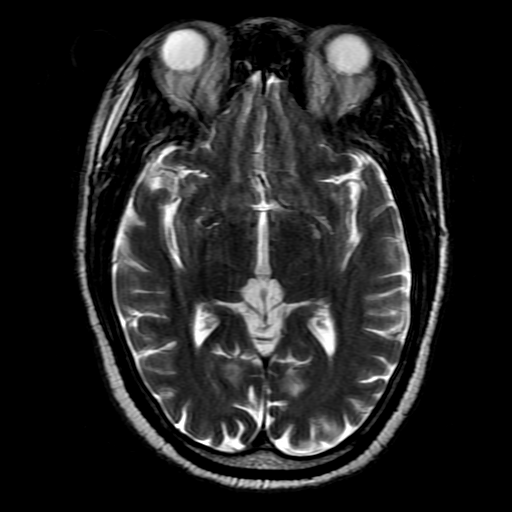
[im 24/24]
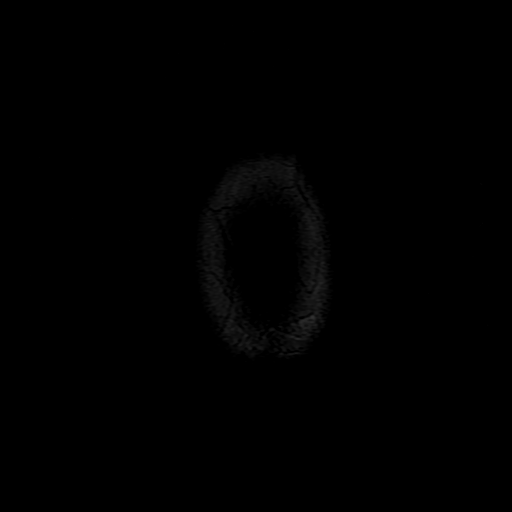

[Series 11: T2 · coronal · 5.0mm · 0.45mm/px · 2 of 29 slices shown (3 of 3)]
[im 1/29]
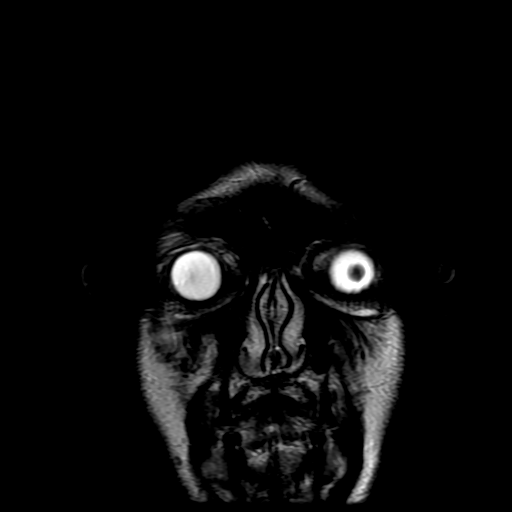
[im 15/29]
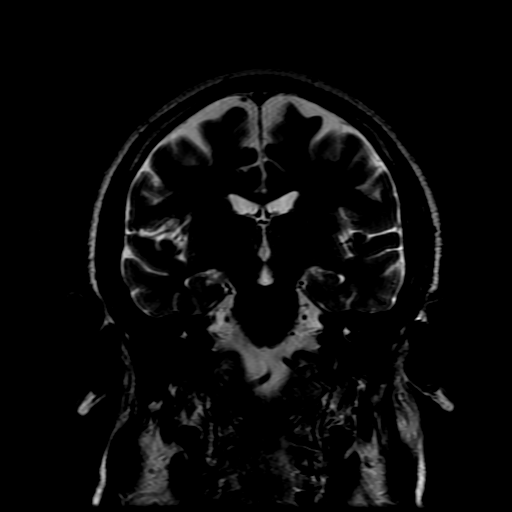

[Series 400: DWI · axial · 3.0mm · 1.09mm/px · z∈[-17,+126]mm · 5 of 49 slices shown (3 of 4)]
[im 1/49]
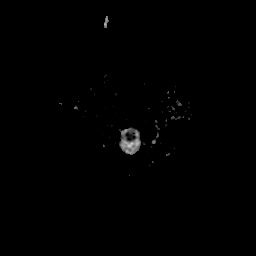
[im 13/49]
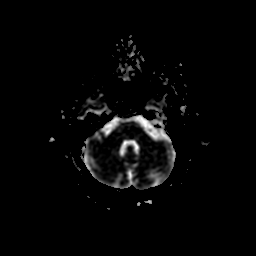
[im 25/49]
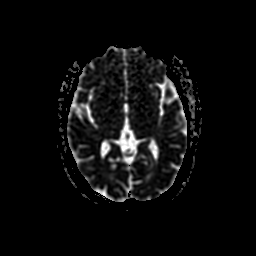
[im 37/49]
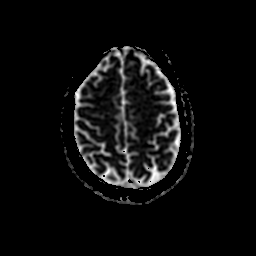
[im 49/49]
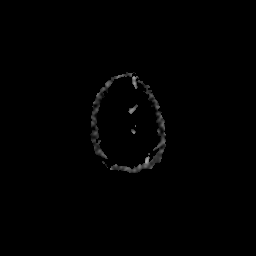

[Series 500: DWI · coronal · 5.0mm · 1.09mm/px · 4 of 37 slices shown (4 of 4)]
[im 1/37]
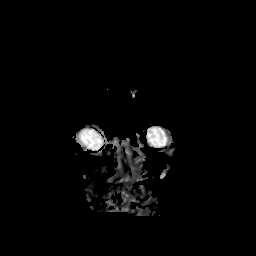
[im 13/37]
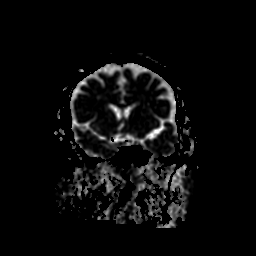
[im 25/37]
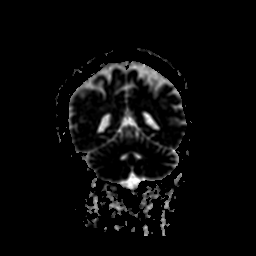
[im 37/37]
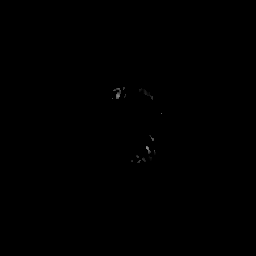

[37 of 48 positions shown; findings below may reference images not displayed]

FINDINGS: Diffusion imaging does not show any acute or subacute infarction. No
abnormality is seen affecting the brainstem or cerebellum. Within
the cerebral hemispheres, there are scattered foci of abnormal T2
and FLAIR signal in the deep and subcortical white matter consistent
with mild chronic small vessel disease. No cortical or large vessel
territory infarction. No mass lesion, hemorrhage, hydrocephalus or
extra-axial collection. No pituitary mass. No inflammatory sinus
disease. No skull or skullbase lesion. Major vessels at the base of
the brain show flow.
IMPRESSION: No acute finding. Mild chronic small-vessel ischemic changes present
within the cerebral hemispheric white matter.

## 2018-08-31 ENCOUNTER — Emergency Department (HOSPITAL_COMMUNITY)
Admission: EM | Admit: 2018-08-31 | Discharge: 2018-08-31 | Disposition: A | Discharge status: Home / Self Care | Payer: Self-pay | Attending: Emergency Medicine | Admitting: Emergency Medicine

## 2018-08-31 ENCOUNTER — Other Ambulatory Visit: Payer: Self-pay

## 2018-08-31 ENCOUNTER — Encounter (HOSPITAL_COMMUNITY): Payer: Self-pay | Admitting: Emergency Medicine

## 2018-08-31 DIAGNOSIS — I10 Essential (primary) hypertension: Secondary | ICD-10-CM | POA: Insufficient documentation

## 2018-08-31 DIAGNOSIS — R739 Hyperglycemia, unspecified: Secondary | ICD-10-CM

## 2018-08-31 DIAGNOSIS — Z79899 Other long term (current) drug therapy: Secondary | ICD-10-CM | POA: Insufficient documentation

## 2018-08-31 DIAGNOSIS — L0390 Cellulitis, unspecified: Secondary | ICD-10-CM

## 2018-08-31 DIAGNOSIS — E1165 Type 2 diabetes mellitus with hyperglycemia: Secondary | ICD-10-CM | POA: Insufficient documentation

## 2018-08-31 DIAGNOSIS — Z7984 Long term (current) use of oral hypoglycemic drugs: Secondary | ICD-10-CM | POA: Insufficient documentation

## 2018-08-31 DIAGNOSIS — L03221 Cellulitis of neck: Secondary | ICD-10-CM | POA: Insufficient documentation

## 2018-08-31 LAB — CBC
HEMATOCRIT: 46.4 % (ref 39.0–52.0)
HEMOGLOBIN: 15.3 g/dL (ref 13.0–17.0)
MCH: 29.5 pg (ref 26.0–34.0)
MCHC: 33 g/dL (ref 30.0–36.0)
MCV: 89.4 fL (ref 80.0–100.0)
Platelets: 283 10*3/uL (ref 150–400)
RBC: 5.19 MIL/uL (ref 4.22–5.81)
RDW: 11.8 % (ref 11.5–15.5)
WBC: 11.8 10*3/uL — ABNORMAL HIGH (ref 4.0–10.5)
nRBC: 0 % (ref 0.0–0.2)

## 2018-08-31 LAB — URINALYSIS, ROUTINE W REFLEX MICROSCOPIC
BACTERIA UA: NONE SEEN
Bilirubin Urine: NEGATIVE
Glucose, UA: >=500 mg/dL — AB
Hgb urine dipstick: NEGATIVE
KETONES UR: NEGATIVE mg/dL
Leukocytes, UA: NEGATIVE
Nitrite: NEGATIVE
PH: 6 (ref 5.0–8.0)
Protein, ur: NEGATIVE mg/dL
Specific Gravity, Urine: 1.026 (ref 1.005–1.030)

## 2018-08-31 LAB — BASIC METABOLIC PANEL
ANION GAP: 11 (ref 5–15)
BUN: 18 mg/dL (ref 8–23)
CHLORIDE: 101 mmol/L (ref 98–111)
CO2: 26 mmol/L (ref 22–32)
Calcium: 9.7 mg/dL (ref 8.9–10.3)
Creatinine, Ser: 0.75 mg/dL (ref 0.61–1.24)
GFR calc Af Amer: >60 mL/min (ref >60)
GLUCOSE: 299 mg/dL — AB (ref 70–99)
POTASSIUM: 3.8 mmol/L (ref 3.5–5.1)
Sodium: 138 mmol/L (ref 135–145)

## 2018-08-31 LAB — CBG MONITORING, ED
GLUCOSE-CAPILLARY: 252 mg/dL — AB (ref 70–99)
Glucose-Capillary: 266 mg/dL — ABNORMAL HIGH (ref 70–99)

## 2018-08-31 MED ORDER — DOXYCYCLINE HYCLATE 100 MG PO CAPS: 100.0000 mg | ORAL_CAPSULE | Freq: Two times a day (BID) | ORAL | 0 refills | Status: AC

## 2018-08-31 MED ORDER — SODIUM CHLORIDE 0.9 % IV BOLUS
1000.0000 mL | Freq: Once | INTRAVENOUS | Status: AC
  Administered 2018-08-31: 1000 mL via INTRAVENOUS

## 2018-08-31 NOTE — ED Notes (Signed)
Pt notified of urine sample needed.  Pt attempted.  Will retry later after fluid bolus

## 2018-08-31 NOTE — ED Notes (Signed)
ED Provider at bedside. 

## 2018-08-31 NOTE — ED Notes (Signed)
UNSUCCESSFUL LAB COLLECTION 

## 2018-08-31 NOTE — ED Triage Notes (Signed)
Pt reports that he went to dentist this morning and was told that his blood sugar was too high (360). Pt reports that he didn't take his medication this morning and had coffee before going to the dentist.  Since the dentist has taken his medications.

## 2018-08-31 NOTE — ED Provider Notes (Signed)
[facility]-EMERGENCY DEPT Provider Note   CSN: [id number] Arrival date & time: 08/31/18  4098     History   Chief Complaint Chief Complaint  Patient presents with  . Hyperglycemia    HPI Bobby Palmer is a 62 y.o. male.  HPI patient presents with hyperglycemia.  States he was going to the dentist today for a cleaning and found to have sugar of 360.  States he has been urinating frequently at night.  States that sugars not really been running this high at home.  States he did have to get up last night to urinate however.  No fevers.  No dental pain but states he does have 2 loose teeth.  He is on oral treatment for his diabetes.  Also States he has an infection on his neck and back.  States he has had these before in [place] and he needs antibiotics for it.  States he will drink at times.  He has not had fevers. Past Medical History:  Diagnosis Date  . Diabetes mellitus without complication (HCC)   . Hypertension     There are no active problems to display for this patient.   History reviewed. No pertinent surgical history.      Home Medications    Prior to Admission medications   Medication Sig Start Date End Date Taking? Authorizing Provider  atenolol (TENORMIN) 100 MG tablet Take 100 mg by mouth daily.   Yes [provider]  glipiZIDE (GLUCOTROL) 10 MG tablet Take 10 mg by mouth 2 (two) times daily.    Yes [provider]  hydrochlorothiazide (HYDRODIURIL) 25 MG tablet Take 25 mg by mouth daily.   Yes [provider]  ibuprofen (ADVIL,MOTRIN) 800 MG tablet Take 800 mg by mouth 4 (four) times daily as needed for headache, mild pain or moderate pain.    Yes [provider]  metFORMIN (GLUCOPHAGE) 1000 MG tablet Take 1,000 mg by mouth 2 (two) times daily with a meal.   Yes [provider]  doxycycline (VIBRAMYCIN) 100 MG capsule Take 1 capsule (100 mg total) by mouth 2 (two) times daily. 08/31/18    Benjiman Core, MD    Family History No family history on file.  Social History Social History   Tobacco Use  . Smoking status: Never Smoker  . Smokeless tobacco: Never Used  Substance Use Topics  . Alcohol use: No  . Drug use: No     Allergies   Patient has no known allergies.   Review of Systems Review of Systems  Constitutional: Negative for appetite change and fever.  HENT: Negative for congestion.   Respiratory: Negative for cough.   Gastrointestinal: Negative for abdominal distention.  Endocrine: Positive for polyuria.  Skin: Positive for wound.  Neurological: Negative for weakness.  Psychiatric/Behavioral: Negative for confusion.     Physical Exam Updated Vital Signs BP (!) 153/86   Pulse (!) 59   Temp 97.7 F (36.5 C) (Oral)   Resp 16   SpO2 96%   Physical Exam  Constitutional: He appears well-developed.  HENT:  Head: Atraumatic.  Neck: Neck supple.  Cardiovascular: Normal rate.  Pulmonary/Chest: Effort normal.  Abdominal: Soft. There is no tenderness.  Musculoskeletal: He exhibits no edema.  Neurological: He is alert.  Skin: Skin is warm. Capillary refill takes less than 2 seconds.  As posterior neck there is an approximate 1 cm tender somewhat indurated area.  Central scabbing.  No drainage.  On his mid back there is a  2 cm tender indurated area.  There is a central scab with slight purulence.  Both had single stab wound with an 18-gauge needle with minimal purulence expressed.     ED Treatments / Results  Labs (all labs ordered are listed, but only abnormal results are displayed) Labs Reviewed  BASIC METABOLIC PANEL - Abnormal; Notable for the following components:      Result Value   Glucose, Bld 299 (*)    All other components within normal limits  CBC - Abnormal; Notable for the following components:   WBC 11.8 (*)    All other components within normal limits  URINALYSIS, ROUTINE W REFLEX MICROSCOPIC - Abnormal; Notable for the  following components:   Glucose, UA >=500 (*)    All other components within normal limits  CBG MONITORING, ED - Abnormal; Notable for the following components:   Glucose-Capillary 266 (*)    All other components within normal limits  CBG MONITORING, ED - Abnormal; Notable for the following components:   Glucose-Capillary 252 (*)    All other components within normal limits    EKG None  Radiology No results found.  Procedures Procedures (including critical care time)  Medications Ordered in ED Medications  sodium chloride 0.9 % bolus 1,000 mL (0 mLs Intravenous Stopped 08/31/18 1220)     Initial Impression / Assessment and Plan / ED Course  I have reviewed the triage vital signs and the nursing notes.  Pertinent labs & imaging results that were available during my care of the patient were reviewed by me and considered in my medical decision making (see chart for details).     Patient mild hyperglycemia.  Not in DKA.  Improved some and has taken oral medicines.  Also has 2 small abscesses/cellulitis on neck and back.  Will give antibiotics.  Discharge home.  May need to follow-up with Dr. Mikeal Hawthorne for further management of his medications.  Final Clinical Impressions(s) / ED Diagnoses   Final diagnoses:  Hyperglycemia  Cellulitis, unspecified cellulitis site    ED Discharge Orders         Ordered    doxycycline (VIBRAMYCIN) 100 MG capsule  2 times daily     08/31/18 1254           Benjiman Core, MD 08/31/18 1317

## 2018-09-01 LAB — CBG MONITORING, ED: Glucose-Capillary: 262 mg/dL — ABNORMAL HIGH (ref 70–99)
